# Patient Record
Sex: Male | Born: 1990 | Race: Black or African American | Hispanic: No | Marital: Single | Smoking: Current every day smoker
Health system: Southern US, Community
[De-identification: ages and names within clinical notes are randomized; demographics above are authoritative.]

---

## 2017-03-23 ENCOUNTER — Emergency Department (HOSPITAL_COMMUNITY)
Admission: EM | Admit: 2017-03-23 | Discharge: 2017-03-23 | Disposition: A | Discharge status: Home / Self Care | Payer: Self-pay | Attending: Emergency Medicine | Admitting: Emergency Medicine

## 2017-03-23 ENCOUNTER — Emergency Department (HOSPITAL_COMMUNITY): Payer: Self-pay

## 2017-03-23 ENCOUNTER — Encounter (HOSPITAL_COMMUNITY): Payer: Self-pay

## 2017-03-23 DIAGNOSIS — Y999 Unspecified external cause status: Secondary | ICD-10-CM | POA: Insufficient documentation

## 2017-03-23 DIAGNOSIS — F17210 Nicotine dependence, cigarettes, uncomplicated: Secondary | ICD-10-CM | POA: Insufficient documentation

## 2017-03-23 DIAGNOSIS — W230XXA Caught, crushed, jammed, or pinched between moving objects, initial encounter: Secondary | ICD-10-CM | POA: Insufficient documentation

## 2017-03-23 DIAGNOSIS — Y9389 Activity, other specified: Secondary | ICD-10-CM | POA: Insufficient documentation

## 2017-03-23 DIAGNOSIS — Y9289 Other specified places as the place of occurrence of the external cause: Secondary | ICD-10-CM | POA: Insufficient documentation

## 2017-03-23 DIAGNOSIS — S62645B Nondisplaced fracture of proximal phalanx of left ring finger, initial encounter for open fracture: Secondary | ICD-10-CM | POA: Insufficient documentation

## 2017-03-23 MED ORDER — OXYCODONE-ACETAMINOPHEN 5-325 MG PO TABS
ORAL_TABLET | ORAL | Status: AC
  Filled 2017-03-23: qty 1

## 2017-03-23 MED ORDER — CEPHALEXIN 500 MG PO CAPS: 500.0000 mg | ORAL_CAPSULE | Freq: Four times a day (QID) | ORAL | 0 refills | Status: AC

## 2017-03-23 MED ORDER — BUPIVACAINE HCL (PF) 0.5 % IJ SOLN
30.0000 mL | Freq: Once | INTRAMUSCULAR | Status: AC
  Administered 2017-03-23: 30 mL
  Filled 2017-03-23: qty 30

## 2017-03-23 MED ORDER — HYDROCODONE-ACETAMINOPHEN 5-325 MG PO TABS: 1.0000 | ORAL_TABLET | ORAL | 0 refills | Status: AC | PRN

## 2017-03-23 MED ORDER — CEFAZOLIN SODIUM-DEXTROSE 2-4 GM/100ML-% IV SOLN
2.0000 g | Freq: Once | INTRAVENOUS | Status: AC
Start: 2017-03-23 — End: 2017-03-23
  Administered 2017-03-23: 2 g via INTRAVENOUS
  Filled 2017-03-23: qty 100

## 2017-03-23 MED ORDER — OXYCODONE-ACETAMINOPHEN 5-325 MG PO TABS
1.0000 | ORAL_TABLET | ORAL | Status: AC | PRN
  Administered 2017-03-23 (×2): 1 via ORAL
  Filled 2017-03-23: qty 1

## 2017-03-23 MED ORDER — BUPIVACAINE HCL (PF) 0.5 % IJ SOLN
10.0000 mL | Freq: Once | INTRAMUSCULAR | Status: DC
  Filled 2017-03-23: qty 10

## 2017-03-23 NOTE — Discharge Instructions (Addendum)
Keep splint on and dry. Keep hand elevated and use ice 15 minutes 4x a day for first 3 days. Return for new or worsening symptoms.

## 2017-03-23 NOTE — ED Provider Notes (Signed)
[facility]-EMERGENCY DEPT Provider Note    By signing my name below, I, Earmon Phoenix, attest that this documentation has been prepared under the direction and in the presence of Arthor Captain, PA-C. Electronically Signed: Earmon Phoenix, ED Scribe. 03/23/17. 1:46 PM.    History   Chief Complaint Chief Complaint  Patient presents with  . Hand Injury   The history is provided by the patient and medical records. No language interpreter was used.    Travis Davis is a 26 y.o. male who presents to the Emergency Department complaining of left hand pain that began PTA. He reports an associated laceration to the hand. Pt states he was changing a tire and the jack fell causing his left fourth digit to become caught between the jack and the tire. He has not taken anything for pain. Touching or moving the hand increases the pain. He denies alleviating factors. He denies fever, chills, nausea, vomiting, numbness, tingling or weakness of the left hand or LUE. He states his last tetanus vaccination was in 2013. He is right hand dominant.    History reviewed. No pertinent past medical history.  There are no active problems to display for this patient.   History reviewed. No pertinent surgical history.     Home Medications    Prior to Admission medications   Medication Sig Start Date End Date Taking? Authorizing Provider  cephALEXin (KEFLEX) 500 MG capsule Take 1 capsule (500 mg total) by mouth 4 (four) times daily. 03/23/17   Arthor Captain, PA-C  HYDROcodone-acetaminophen (NORCO) 5-325 MG tablet Take 1-2 tablets by mouth every 4 (four) hours as needed. 03/23/17   Arthor Captain, PA-C    Family History No family history on file.  Social History Social History  Substance Use Topics  . Smoking status: Current Every Day Smoker    Packs/day: 0.50    Types: Cigarettes  . Smokeless tobacco: Never Used  . Alcohol use Yes     Allergies   Patient has no known allergies.   Review of  Systems Review of Systems  Constitutional: Negative for chills and fever.  Gastrointestinal: Negative for nausea and vomiting.  Musculoskeletal: Positive for arthralgias and joint swelling.  Skin: Positive for wound.  Neurological: Negative for weakness and numbness.     Physical Exam Updated Vital Signs BP (!) 144/81 (BP Location: Right Arm)   Pulse (!) 111   Temp 97.9 F (36.6 C) (Oral)   Resp 16   Ht  (1.727 m)   Wt 138 lb (62.6 kg)   SpO2 98%   BMI 20.98 kg/m   Physical Exam  Constitutional: He is oriented to person, place, and time. He appears well-developed and well-nourished.  HENT:  Head: Normocephalic and atraumatic.  Neck: Normal range of motion.  Cardiovascular: Normal rate.   Pulmonary/Chest: Effort normal.  Musculoskeletal: He exhibits edema and tenderness. He exhibits no deformity.  Swelling and pain over the fourth proximal phalanx of left hand. 1 cm laceration over MCP joint. Able to flex and extend over each joint of the hand although it is limited by pain.  Neurological: He is alert and oriented to person, place, and time.  Skin: Skin is warm and dry.  Psychiatric: He has a normal mood and affect. His behavior is normal.  Nursing note and vitals reviewed.    ED Treatments / Results  DIAGNOSTIC STUDIES: Oxygen Saturation is 98% on RA, normal by my interpretation.   COORDINATION OF CARE: 11:44 AM- Will order splint and give  referral to hand specialist. Pt verbalizes understanding and agrees to plan.  Medications  oxyCODONE-acetaminophen (PERCOCET/ROXICET) 5-325 MG per tablet (not administered)  oxyCODONE-acetaminophen (PERCOCET/ROXICET) 5-325 MG per tablet 1 tablet (1 tablet Oral Given 03/23/17 1231)  bupivacaine (MARCAINE) 0.5 % injection 30 mL (30 mLs Infiltration Given 03/23/17 1311)  ceFAZolin (ANCEF) IVPB 2g/100 mL premix (0 g Intravenous Stopped 03/23/17 1356)   Labs (all labs ordered are listed, but only abnormal results are  displayed) Labs Reviewed - No data to display  EKG  EKG Interpretation None       Radiology Dg Hand Complete Left  Result Date: 03/23/2017 CLINICAL DATA:  Crushing injury left ring finger today with a car jack. EXAM: LEFT HAND - COMPLETE 3+ VIEW COMPARISON:  None. FINDINGS: Mildly comminuted midshaft fracture of the proximal phalanx ring finger, somewhat sinusoidal fracture plane which is generally nondisplaced. There may be 1-2 anterior fragments in addition to the dominant proximal and distal fragments. Wall corticated ossicle in the expected position of the ulnar styloid, probably from an old fracture, less likely failure fusion. Focal widening of the distal radial metadiaphysis, probably from an old fracture. IMPRESSION: 1. Nondisplaced acute mildly comminuted midshaft fracture of the proximal phalanx ring finger. 2. Old deformity of the distal radial metadiaphysis probably from an old healed fracture. Separate ossicle of the ulnar styloid potentially from old fracture or failure of fusion. Electronically Signed   By: Gaylyn Rong M.D.   On: 03/23/2017 10:35    Procedures .Marland KitchenLaceration Repair Date/Time: 03/23/2017 1:20 PM Performed by: Arthor Captain Authorized by: Arthor Captain   Consent:    Consent obtained:  Verbal   Consent given by:  Patient Anesthesia (see MAR for exact dosages):    Anesthesia method:  Nerve block   Block location:  MCP joint   Block needle gauge:  25 G   Block anesthetic:  Bupivacaine 0.5% w/o epi   Block injection procedure:  Anatomic landmarks identified   Block outcome:  Anesthesia achieved Laceration details:    Location:  Finger   Finger location:  R ring finger   Length (cm):  1 Repair type:    Repair type:  Simple Pre-procedure details:    Preparation:  Patient was prepped and draped in usual sterile fashion Exploration:    Hemostasis achieved with:  Direct pressure   Wound exploration: entire depth of wound probed and visualized      Wound extent: underlying fracture     Wound extent: no nerve damage noted and no tendon damage noted     Contaminated: no   Treatment:    Area cleansed with:  Betadine   Amount of cleaning:  Standard   Irrigation solution:  Sterile water Skin repair:    Repair method:  Sutures   Suture size:  4-0   Suture material:  Chromic gut   Suture technique:  Running locked   Number of sutures:  3 Approximation:    Approximation:  Close   Vermilion border: well-aligned   Post-procedure details:    Dressing:  Antibiotic ointment and sterile dressing   Patient tolerance of procedure:  Tolerated well, no immediate complications   (including critical care time)  SPLINT APPLICATION Date/Time: 4:41 PM Authorized by: Arthor Captain Consent: Verbal consent obtained. Risks and benefits: risks, benefits and alternatives were discussed Consent given by: patient Splint applied by: orthopedic technician Location details: Ring finger Splint type: finger splint Supplies used: pre fab splint Post-procedure: The splinted body part was neurovascularly unchanged following the procedure.  Patient tolerance: Patient tolerated the procedure well with no immediate complications.    Medications Ordered in ED Medications  oxyCODONE-acetaminophen (PERCOCET/ROXICET) 5-325 MG per tablet (not administered)  oxyCODONE-acetaminophen (PERCOCET/ROXICET) 5-325 MG per tablet 1 tablet (1 tablet Oral Given 03/23/17 1231)  bupivacaine (MARCAINE) 0.5 % injection 30 mL (30 mLs Infiltration Given 03/23/17 1311)  ceFAZolin (ANCEF) IVPB 2g/100 mL premix (0 g Intravenous Stopped 03/23/17 1356)     Initial Impression / Assessment and Plan / ED Course  I have reviewed the triage vital signs and the nursing notes.  Pertinent labs & imaging results that were available during my care of the patient were reviewed by me and considered in my medical decision making (see chart for details).     Patient X-Ray showing mildly  comminuted midshaft fracture of the proximal phalanx ring finger, nondisplaced. Treated as open fracture. Ortho consult by Marge Duncans, PA-C (please see his note).  Pt advised to follow up with hand specialist. Patient given splint while in ED, conservative therapy recommended and discussed. Patient will be discharged home & is agreeable with above plan. Returns precautions discussed. Pt appears safe for discharge.   Final Clinical Impressions(s) / ED Diagnoses   Final diagnoses:  Open nondisplaced fracture of proximal phalanx of left ring finger, initial encounter    New Prescriptions Discharge Medication List as of 03/23/2017  2:26 PM    START taking these medications   Details  cephALEXin (KEFLEX) 500 MG capsule Take 1 capsule (500 mg total) by mouth 4 (four) times daily., Starting Mon 03/23/2017, Print    HYDROcodone-acetaminophen (NORCO) 5-325 MG tablet Take 1-2 tablets by mouth every 4 (four) hours as needed., Starting Mon 03/23/2017, Print        I personally performed the services described in this documentation, which was scribed in my presence. The recorded information has been reviewed and is accurate.       Arthor Captain, PA-C 03/23/17 1641    Benjiman Core, MD 03/23/17 2016

## 2017-03-23 NOTE — ED Triage Notes (Signed)
Per Pt, Pt was changing his tire when his hand got caught between the jack and the tire. Pt reports pain in his middle finger. Slight controlled bleeding noted.

## 2017-03-23 NOTE — ED Notes (Signed)
Ortho tech paged and returned phone call.

## 2017-03-23 NOTE — Progress Notes (Signed)
Orthopedic Tech Progress Note Patient Details:  Travis Davis Oct 06, 1991 [id number]  Ortho Devices Type of Ortho Device: Finger splint Ortho Device/Splint Location: applied finger splint to pt Left hand.  pt tolerated well.  (static finger splint).  Ortho Device/Splint Interventions: Application   Alvina Chou 03/23/2017, 2:51 PM

## 2017-03-23 NOTE — Consult Note (Signed)
Reason for Consult:4th prox phalanx fx Referring Physician: Brayton Layman is an 26 y.o. male.  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 He sought care in the ED. 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 This was not a work related injury.  History reviewed. No pertinent past medical history.  History reviewed. No pertinent surgical history.  No family history on file.  Social History:  reports that he has been smoking Cigarettes.  He has been smoking about 0.50 packs per day. He has never used smokeless tobacco. He reports that he drinks alcohol. He reports that he does not use drugs.  Allergies: No Known Allergies  Medications: I have reviewed the patient's current medications.  No results found for this or any previous visit (from the past 48 hour(s)).  Dg Hand Complete Left  Result Date: 03/23/2017 CLINICAL DATA:  Crushing injury left ring finger today with a car jack. EXAM: LEFT HAND - COMPLETE 3+ VIEW COMPARISON:  None. FINDINGS: Mildly comminuted midshaft fracture of the proximal phalanx ring finger, somewhat sinusoidal fracture plane which is generally nondisplaced. There may be 1-2 anterior fragments in addition to the dominant proximal and distal fragments. Wall corticated ossicle in the expected position of the ulnar styloid, probably from an old fracture, less likely failure fusion. Focal widening of the distal radial metadiaphysis, probably from an old fracture. IMPRESSION: 1. Nondisplaced acute mildly comminuted midshaft fracture of the proximal phalanx ring finger. 2. Old deformity of the distal radial metadiaphysis probably from an old healed fracture. Separate ossicle of the ulnar styloid potentially from old fracture or failure of fusion. Electronically Signed   By: Gaylyn Rong M.D.   On: 03/23/2017 10:35    Review of Systems  Constitutional: Negative for weight  loss.  HENT: Negative for ear discharge, ear pain, hearing loss and tinnitus.   Eyes: Negative for blurred vision, double vision, photophobia and pain.  Respiratory: Negative for cough, sputum production and shortness of breath.   Cardiovascular: Negative for chest pain.  Gastrointestinal: Negative for abdominal pain, nausea and vomiting.  Genitourinary: Negative for dysuria, flank pain, frequency and urgency.  Musculoskeletal: Positive for joint pain (Left 4th finger/hand). Negative for back pain, falls, myalgias and neck pain.  Neurological: Negative for dizziness, tingling, sensory change, focal weakness, loss of consciousness and headaches.  Endo/Heme/Allergies: Does not bruise/bleed easily.  Psychiatric/Behavioral: Negative for depression, memory loss and substance abuse. The patient is not nervous/anxious.    Blood pressure (!) 144/81, pulse (!) 111, temperature 97.9 F (36.6 C), temperature source Oral, resp. rate 16, height  (1.727 m), weight 62.6 kg (138 lb), SpO2 98 %. Physical Exam  Constitutional: He appears well-developed and well-nourished.  HENT:  Head: Normocephalic.  Eyes: Conjunctivae are normal. Right eye exhibits no discharge. Left eye exhibits no discharge. No scleral icterus.  Cardiovascular: Normal rate.   Respiratory: Effort normal. No respiratory distress.  Musculoskeletal:  Left shoulder, elbow, wrist - no skin wounds, nontender, no instability, no blocks to motion. Hand with small laceration over palmar PIP joint  Sens  Ax/R/M/U intact, paresthetic distal to injury  Mot   Ax/ R/ PIN/ M/ AIN/ U intact but limited by pain  Rad 2+   Neurological: He is alert.  Skin: Skin is warm and dry.  Psychiatric: He has a normal mood and affect. His behavior is normal.    Assessment/Plan:  Open left 4th proximal phalanx fx -- His tetanus is UTD. Will give dose of Ancef. Plan initial non-operative treatment with irrigation, closure (may not need), and splint. F/u with  Dr. Melvyn Novas in office. It's not clear this is an open fx given the location of the laceration but better to err on the side of caution.    Freeman Caldron, PA-C Orthopedic Surgery [phone] 03/23/2017, 12:41 PM

## 2022-05-07 ENCOUNTER — Encounter (HOSPITAL_COMMUNITY): Payer: Self-pay

## 2022-05-07 ENCOUNTER — Emergency Department (HOSPITAL_COMMUNITY): Payer: Self-pay

## 2022-05-07 ENCOUNTER — Emergency Department (HOSPITAL_COMMUNITY)
Admission: EM | Admit: 2022-05-07 | Discharge: 2022-05-07 | Disposition: A | Discharge status: Home / Self Care | Payer: Self-pay | Attending: Emergency Medicine | Admitting: Emergency Medicine

## 2022-05-07 ENCOUNTER — Other Ambulatory Visit: Payer: Self-pay

## 2022-05-07 DIAGNOSIS — M25512 Pain in left shoulder: Secondary | ICD-10-CM | POA: Insufficient documentation

## 2022-05-07 DIAGNOSIS — Y990 Civilian activity done for income or pay: Secondary | ICD-10-CM | POA: Insufficient documentation

## 2022-05-07 DIAGNOSIS — X500XXA Overexertion from strenuous movement or load, initial encounter: Secondary | ICD-10-CM | POA: Insufficient documentation

## 2022-05-07 DIAGNOSIS — G8911 Acute pain due to trauma: Secondary | ICD-10-CM | POA: Insufficient documentation

## 2022-05-07 NOTE — Discharge Instructions (Addendum)
You came to the emergency department today to be evaluated for your left shoulder pain.  Your x-ray did not show any broken bones or dislocations.  Due to your reports of decreased range of motion you were placed in a sling.  Please continue to use the sling until you follow-up with an orthopedic doctor.  Please call the orthopedic doctor listed on this paperwork to schedule a follow-up appointment.  Please take Ibuprofen (Advil, motrin) and Tylenol (acetaminophen) to relieve your pain.    You may take up to 600 MG (3 pills) of normal strength ibuprofen every 8 hours as needed.   You make take tylenol, up to 1,000 mg (two extra strength pills) every 8 hours as needed.   It is safe to take ibuprofen and tylenol at the same time as they work differently.   Do not take more than 3,000 mg tylenol in a 24 hour period (not more than one dose every 8 hours.  Please check all medication labels as many medications such as pain and cold medications may contain tylenol.  Do not drink alcohol while taking these medications.  Do not take other NSAID'S while taking ibuprofen (such as aleve or naproxen).  Please take ibuprofen with food to decrease stomach upset.   Get help right away if: Your arm, hand, or fingers: Tingle. Become numb. Become swollen. Become painful. Turn white or blue.

## 2022-05-07 NOTE — ED Provider Notes (Signed)
[facility] EMERGENCY DEPARTMENT Provider Note   CSN: [id number] Arrival date & time: 05/07/22  0354     History  Chief Complaint  Patient presents with   Shoulder Pain    Travis Davis is a 31 y.o. male with no pertinent past medical history.  Presents to the emergency department with a chief complaint of left shoulder pain.  Patient reports that while at work on Monday he lifted a propane take an of a truck and started having immediate pain to his left shoulder.  Patient did not feel or hear any pops.  Patient has had constant pain to left shoulder.  Pain is worse with touch and movement.  Patient has been taking ibuprofen with minimal improvement in his pain.  Patient took 500 mg ibuprofen just prior to arrival in the emergency department.   Patient is right-hand dominant.  Denies any fever, chills, numbness, weakness, color change, pallor, wound, IV drug use.   Shoulder Pain Associated symptoms: no back pain, no fever and no neck pain        Home Medications Prior to Admission medications   Medication Sig Start Date End Date Taking? Authorizing Provider  cephALEXin (KEFLEX) 500 MG capsule Take 1 capsule (500 mg total) by mouth 4 (four) times daily. 03/23/17   Arthor Captain, PA-C  HYDROcodone-acetaminophen (NORCO) 5-325 MG tablet Take 1-2 tablets by mouth every 4 (four) hours as needed. 03/23/17   Arthor Captain, PA-C      Allergies    Patient has no known allergies.    Review of Systems   Review of Systems  Constitutional:  Negative for chills and fever.  Gastrointestinal:  Negative for nausea and vomiting.  Musculoskeletal:  Positive for arthralgias. Negative for back pain, joint swelling and neck pain.  Skin:  Negative for color change, rash and wound.  Neurological:  Negative for weakness and numbness.  Psychiatric/Behavioral:  Negative for confusion.     Physical Exam Updated Vital Signs BP 123/70 (BP Location: Right Arm)   Pulse 73   Temp  97.7 F (36.5 C) (Oral)   Resp 16   Ht 5\' 8"  (1.727 m)   Wt 63.5 kg   SpO2 100%   BMI 21.29 kg/m  Physical Exam Vitals and nursing note reviewed.  Constitutional:      General: He is not in acute distress.    Appearance: He is not ill-appearing, toxic-appearing or diaphoretic.  HENT:     Head: Normocephalic.  Eyes:     General: No scleral icterus.       Right eye: No discharge.        Left eye: No discharge.  Cardiovascular:     Rate and Rhythm: Normal rate.     Pulses:          Radial pulses are 2+ on the left side.  Pulmonary:     Effort: Pulmonary effort is normal.  Musculoskeletal:     Right shoulder: No swelling, deformity, effusion, laceration, tenderness, bony tenderness or crepitus. Normal range of motion.     Left shoulder: Tenderness and bony tenderness present. No swelling, deformity, effusion, laceration or crepitus. Decreased range of motion.     Right upper arm: Normal.     Left upper arm: Normal.     Right elbow: Normal.     Left elbow: Normal.     Right forearm: Normal.     Left forearm: Normal.     Right wrist: Normal.     Left  wrist: Normal.     Right hand: No swelling, deformity, lacerations, tenderness or bony tenderness. Normal range of motion. Normal strength. Normal sensation.     Left hand: No swelling, deformity, lacerations, tenderness or bony tenderness. Normal range of motion. Normal strength. Normal sensation.     Comments: Diffuse tenderness to anterior and posterior his left shoulder.  Decreased range of motion secondary to complaints of pain.  Pain is worse with abduction of left shoulder.  Pulse, motor, and sensation intact distally.  Skin:    General: Skin is warm and dry.  Neurological:     General: No focal deficit present.     Mental Status: He is alert and oriented to person, place, and time.     GCS: GCS eye subscore is 4. GCS verbal subscore is 5. GCS motor subscore is 6.  Psychiatric:        Behavior: Behavior is cooperative.      ED Results / Procedures / Treatments   Labs (all labs ordered are listed, but only abnormal results are displayed) Labs Reviewed - No data to display  EKG None  Radiology DG Shoulder Left  Result Date: 05/07/2022 CLINICAL DATA:  Left shoulder pain for 3 days following lifting injury. EXAM: LEFT SHOULDER - 2+ VIEW COMPARISON:  Radiographs 01/20/2015 FINDINGS: The mineralization and alignment are normal. There is no evidence of acute fracture or dislocation. The joint spaces are preserved. The soft tissues appear unremarkable. IMPRESSION: Stable normal left shoulder radiographs. Electronically Signed   By: Carey BullocksWilliam  Veazey M.D.   On: 05/07/2022 07:50    Procedures Procedures    Medications Ordered in ED Medications - No data to display  ED Course/ Medical Decision Making/ A&P                           Medical Decision Making Amount and/or Complexity of Data Reviewed Radiology: ordered.   Alert 31 year old male in no acute distress, nontoxic-appearing.  Presents to the ED with a chief complaint of left shoulder pain.  Information is obtained from patient.  Past medical records were reviewed including previous prior notes and imaging.  With reports of left shoulder pain differential includes but is not limited to acute dislocation, acute osseous abnormality, rotator cuff injury, septic arthritis.  Labs and joint aspiration were considered to evaluate for septic arthritis however low suspicion at this time as patient is afebrile and denies any IV drug use.  Additionally patient has no swelling, erythema, or warmth to affected joint.  X-ray imaging was obtained to evaluate for acute osseous abnormality.  I personally viewed and interpreted patient's imaging which shows no acute osseous abnormality.  We will place patient in sling and have him follow-up with orthopedic providers.  Unable to give patient Toradol injection as he took ibuprofen just prior to arrival.  Discussed  symptomatic treatment with over-the-counter pain medication, ice, and rest.  Discussed shoulder range of motion while using sling.  Patient is to continue using sling until he can follow-up with orthopedic provider in outpatient setting.  Based on patient's chief complaint, I considered admission might be necessary, however after reassuring ED workup feel patient is reasonable for discharge.  Discussed results, findings, treatment and follow up. Patient advised of return precautions. Patient verbalized understanding and agreed with plan.  Portions of this note were generated with Scientist, clinical (histocompatibility and immunogenetics)Dragon dictation software. Dictation errors may occur despite best attempts at proofreading.         Final  Clinical Impression(s) / ED Diagnoses Final diagnoses:  Acute pain of left shoulder    Rx / DC Orders ED Discharge Orders     None         Haskel Schroeder, PA-C 05/07/22 1008    Alvira Monday, MD 05/08/22 0008

## 2022-05-07 NOTE — ED Triage Notes (Signed)
Pt arrived POV from home c/o left shoulder pain. Pt states he was at work and 2 days ago he felt something pop in his shoulder and now he can barely use his arm.

## 2022-09-26 IMAGING — DX DG SHOULDER 2+V*L*
3 series · 3 of 3 positions shown · non-contrast
Comparison: Radiographs 01/20/2015

CLINICAL DATA: Left shoulder pain for 3 days following lifting
injury.

EXAM:
LEFT SHOULDER - 2+ VIEW

[shoulder grashey]
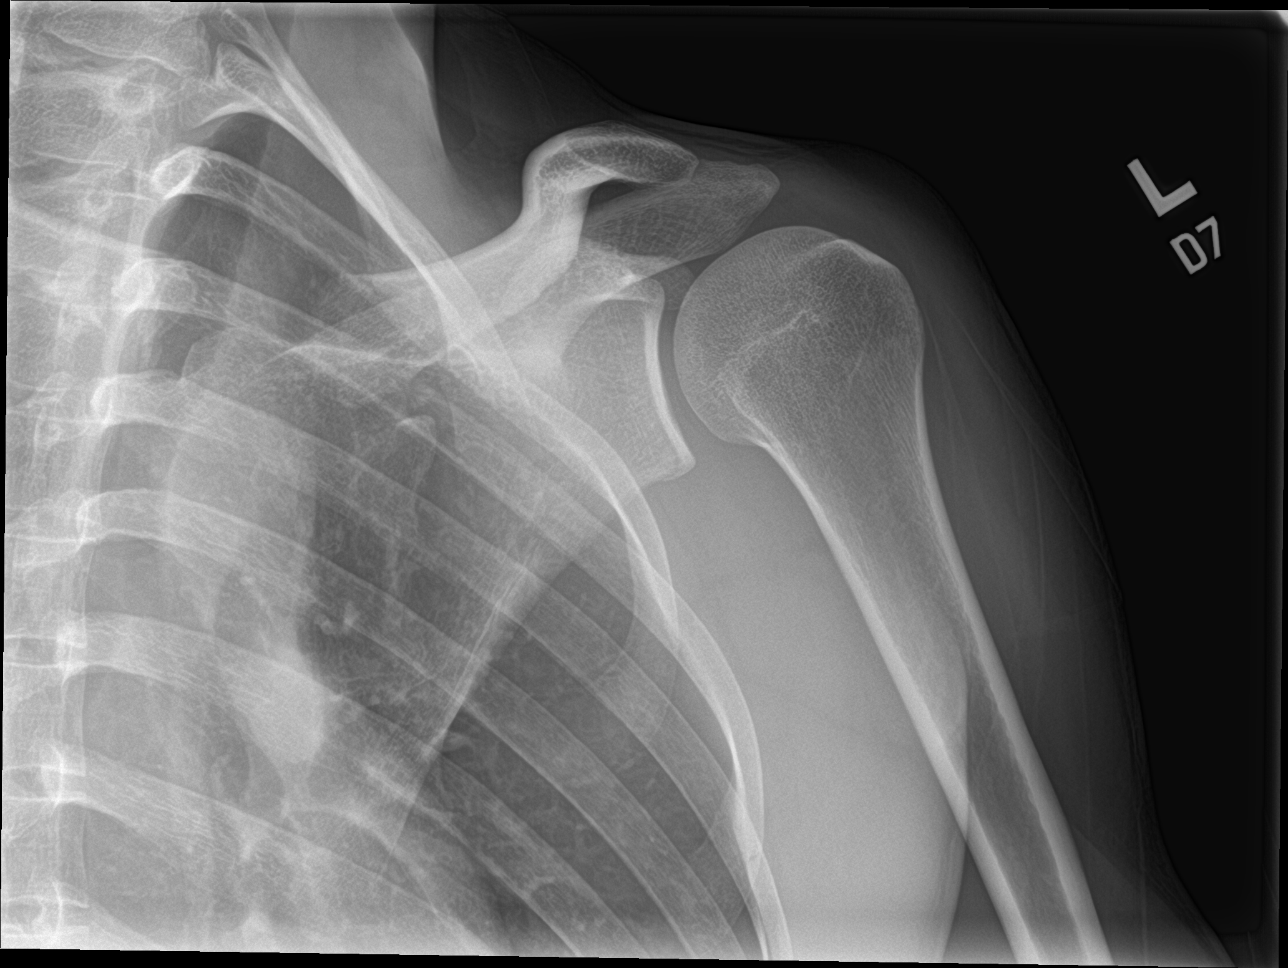

[shoulder y view]
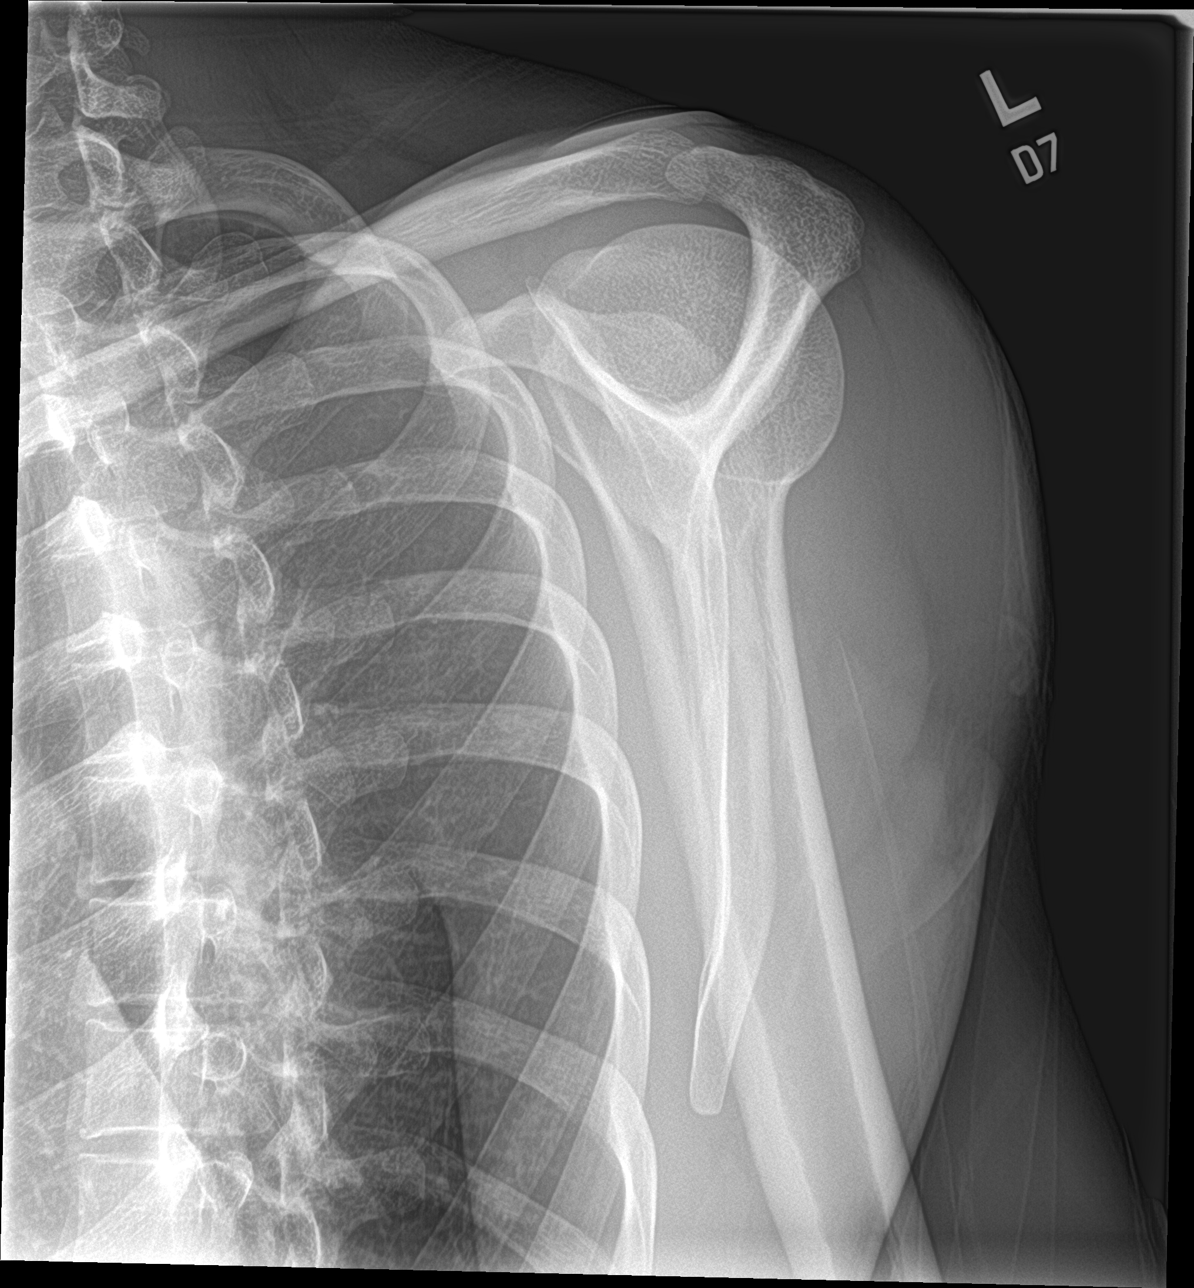

[shoulder axillary]
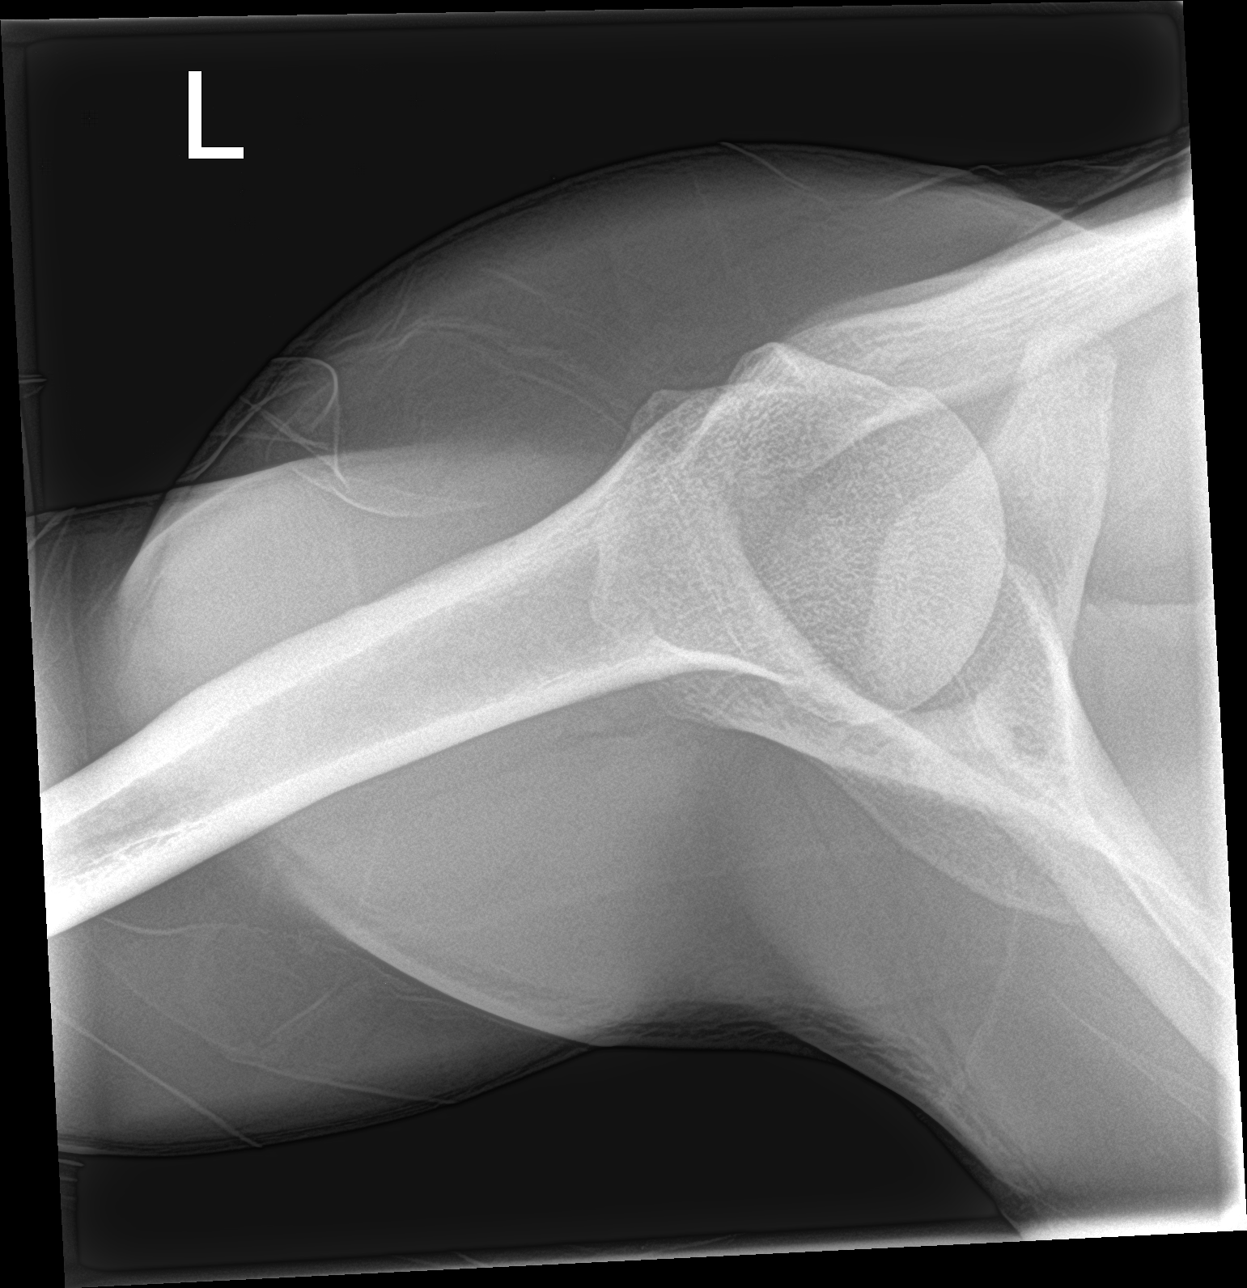

[3 of 3 positions shown; findings below may reference images not displayed]

FINDINGS: The mineralization and alignment are normal. There is no evidence of
acute fracture or dislocation. The joint spaces are preserved. The
soft tissues appear unremarkable.
IMPRESSION: Stable normal left shoulder radiographs.
